# Patient Record
Sex: Male | Born: 1995 | Race: Black or African American | Hispanic: No
Health system: Southern US, Community
[De-identification: ages and names within clinical notes are randomized; demographics above are authoritative.]

---
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
## 2019-08-03 DIAGNOSIS — R079 Chest pain, unspecified: Secondary | ICD-10-CM | POA: Insufficient documentation

## 2019-08-03 NOTE — Progress Notes (Deleted)
    Patient referred by [name], MD for ***  Subjective:   Brandon Peterson, male    DOB: 05/26/1996, 24 y.o.   MRN: [id number]  *** No chief complaint on file.   *** HPI  24 y.o. *** male with ***  *** No past medical history on file.  *** *** The histories are not reviewed yet. Please review them in the "History" navigator section and refresh this SmartLink.  *** Social History   Tobacco Use  Smoking Status Not on file    Social History   Substance and Sexual Activity  Alcohol Use Not on file    *** No family history on file.  *** No current outpatient medications on file prior to visit.   No current facility-administered medications on file prior to visit.    Cardiovascular and other pertinent studies:  *** EKG ***/***/202***: ***  EKG 07/08/2019: Sinus rhythm 74 bpm.  Early repolarization changes.   Recent labs: 06/10/2019: Glucose 91, BUN/Cr 14/1.24. EGFR 87. Na/K 138/4.1. Rest of the CMP normal H/H 14/43. MCV 85. Platelets 245 TSH 1.2 normal Vit D 11 very low Trop negative  *** ROS      *** There were no vitals filed for this visit.   There is no height or weight on file to calculate BMI. There were no vitals filed for this visit.  *** Objective:   Physical Exam    ***     Assessment & Recommendations:   ***  ***     Thank you for referring the patient to [place]. Please feel free to contact with any questions.  Nigel Mormon, MD [facility] Pager: [phone] Office: [phone]

## 2019-08-04 ENCOUNTER — Ambulatory Visit: Payer: Self-pay | Admitting: Cardiology
